# Patient Record
Sex: Male | Born: 1985 | Race: Black or African American | Hispanic: No | Marital: Single | Smoking: Current every day smoker
Health system: Southern US, Community
[De-identification: ages and names within clinical notes are randomized; demographics above are authoritative.]

## PROBLEM LIST (undated history)

## (undated) HISTORY — PX: ROTATOR CUFF REPAIR: SHX139

---

## 2008-09-12 ENCOUNTER — Emergency Department (HOSPITAL_COMMUNITY): Admission: EM | Admit: 2008-09-12 | Discharge: 2008-09-12 | Payer: Self-pay | Admitting: Emergency Medicine

## 2009-08-04 IMAGING — CR DG CHEST 2V
2 series · 2 of 2 positions shown · non-contrast
Comparison: None

CLINICAL DATA: Fever.  Congestion.

CHEST - 2 VIEW

[w chest pa]
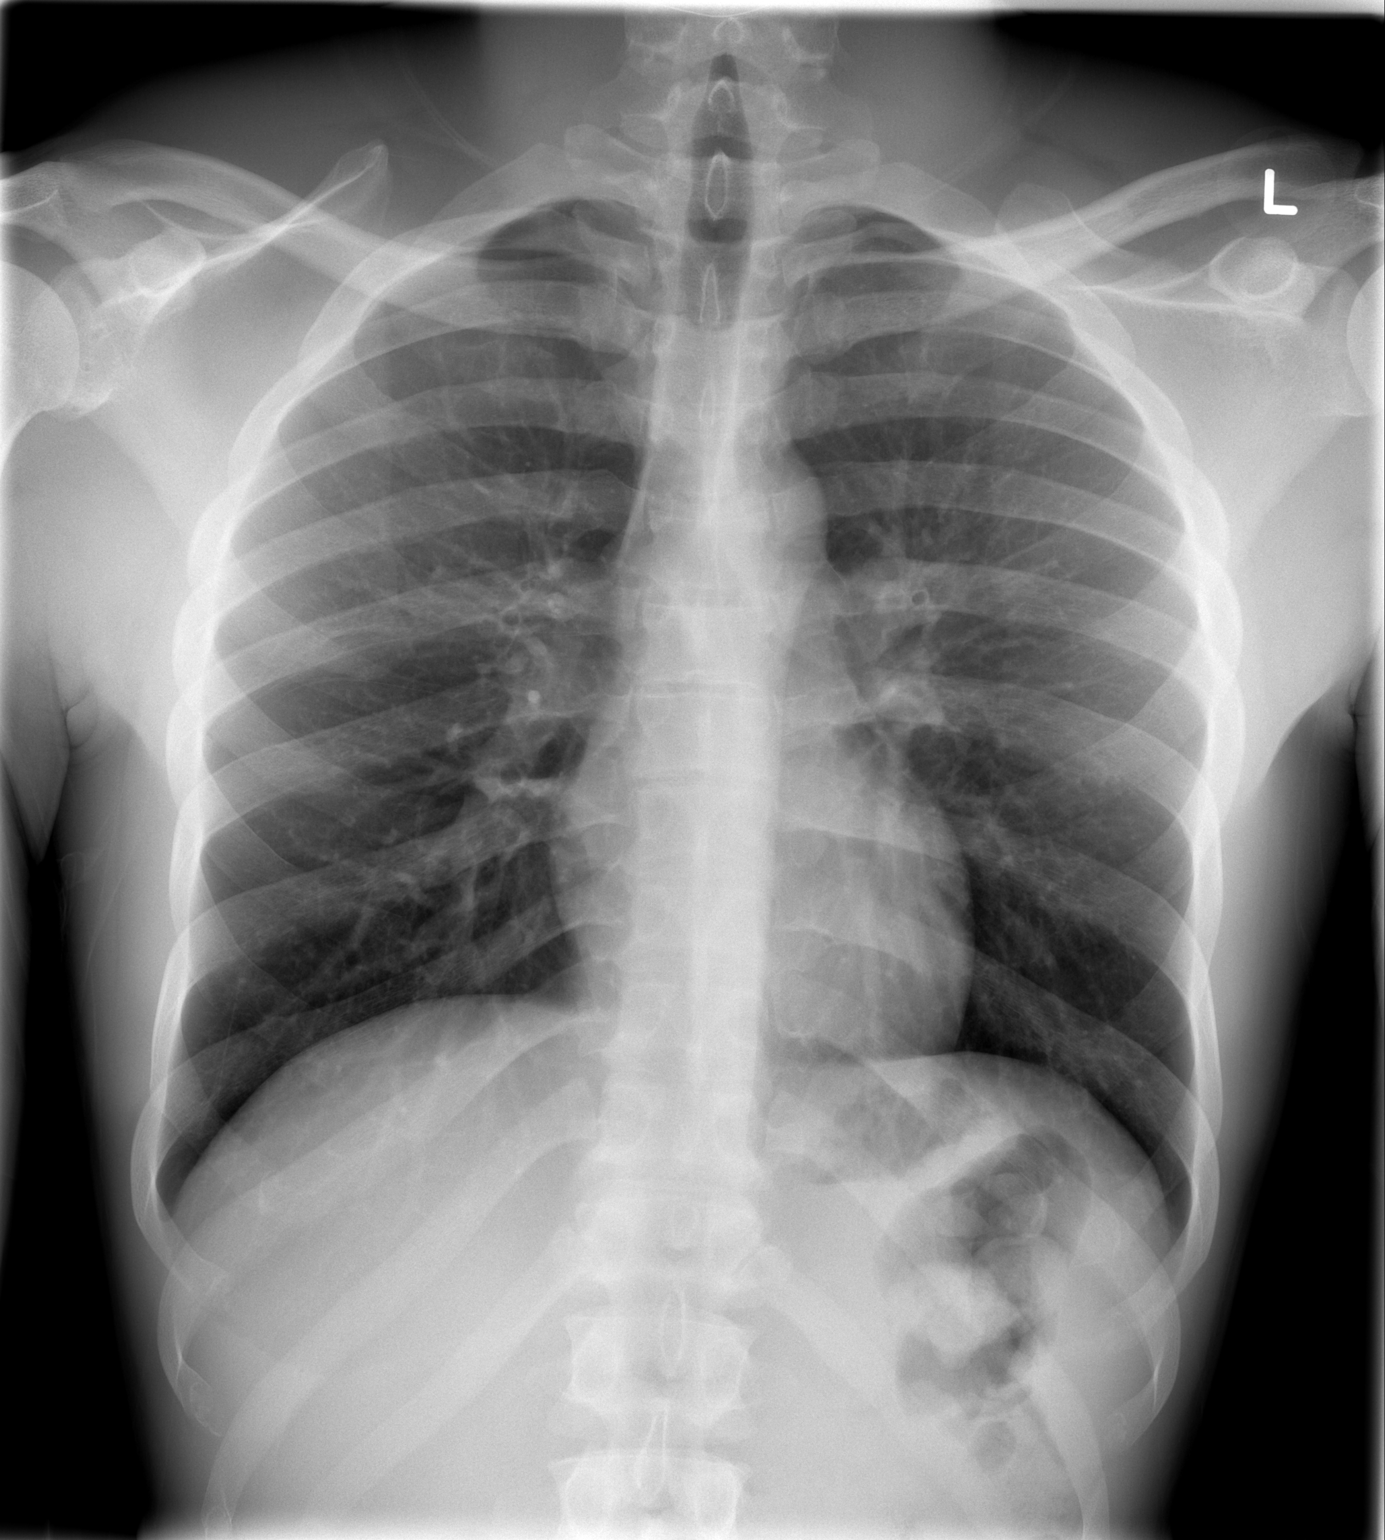

[w chest lat]
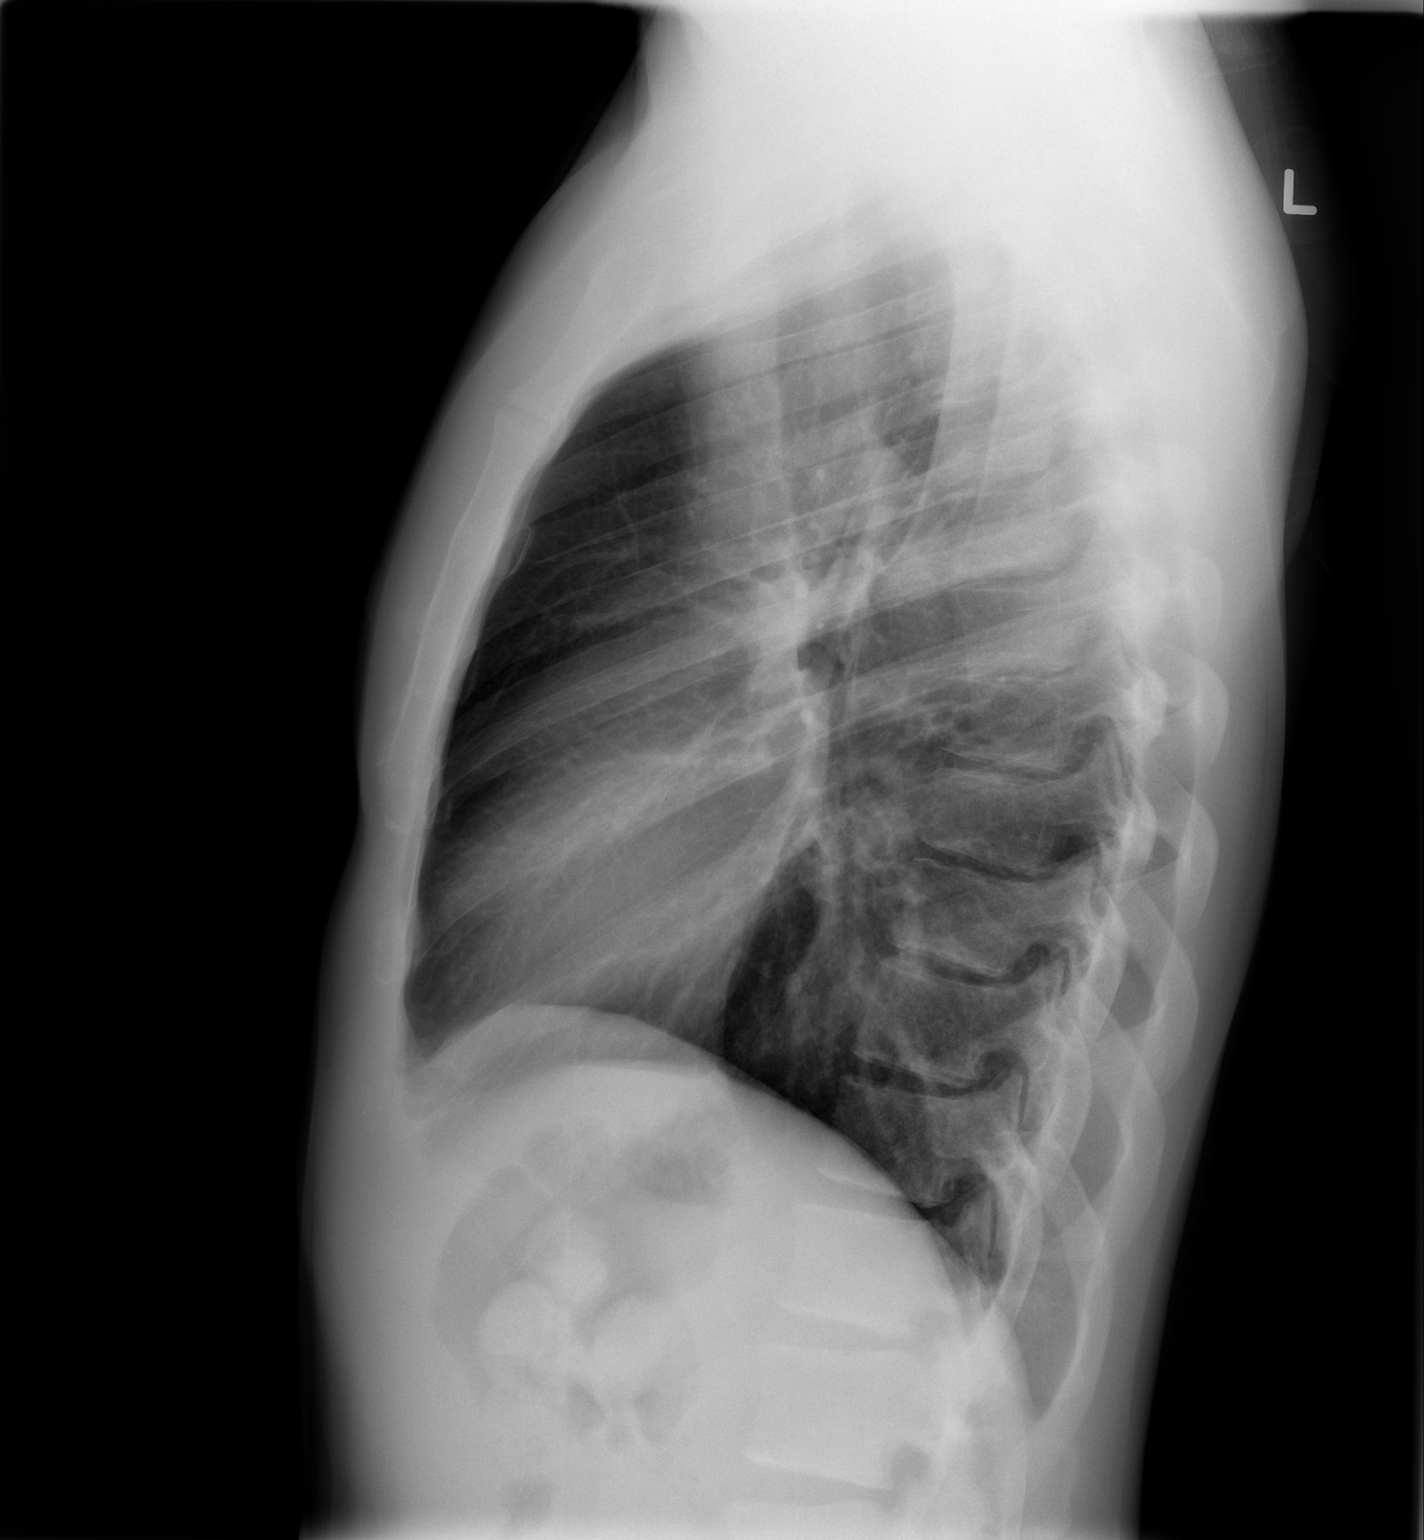

[2 of 2 positions shown; findings below may reference images not displayed]

FINDINGS: Midline trachea. Normal heart size and mediastinal
contours. No pleural effusion or pneumothorax. Clear lungs.
IMPRESSION: No acute cardiopulmonary disease.

## 2011-12-07 ENCOUNTER — Encounter (HOSPITAL_COMMUNITY): Payer: Self-pay | Admitting: *Deleted

## 2011-12-07 ENCOUNTER — Emergency Department (HOSPITAL_COMMUNITY)
Admission: EM | Admit: 2011-12-07 | Discharge: 2011-12-07 | Disposition: A | Discharge status: Home / Self Care | Payer: 59 | Source: Home / Self Care | Attending: Emergency Medicine | Admitting: Emergency Medicine

## 2011-12-07 DIAGNOSIS — N342 Other urethritis: Secondary | ICD-10-CM

## 2011-12-07 MED ORDER — LIDOCAINE HCL (PF) 1 % IJ SOLN
INTRAMUSCULAR | Status: AC
  Filled 2011-12-07: qty 5

## 2011-12-07 MED ORDER — CEFTRIAXONE SODIUM 250 MG IJ SOLR
250.0000 mg | Freq: Once | INTRAMUSCULAR | Status: AC
  Administered 2011-12-07: 250 mg via INTRAMUSCULAR

## 2011-12-07 MED ORDER — CEFTRIAXONE SODIUM 250 MG IJ SOLR
INTRAMUSCULAR | Status: AC
  Filled 2011-12-07: qty 250

## 2011-12-07 MED ORDER — AZITHROMYCIN 250 MG PO TABS
1000.0000 mg | ORAL_TABLET | Freq: Once | ORAL | Status: AC
  Administered 2011-12-07: 1000 mg via ORAL

## 2011-12-07 MED ORDER — AZITHROMYCIN 250 MG PO TABS
ORAL_TABLET | ORAL | Status: AC
  Filled 2011-12-07: qty 4

## 2011-12-07 NOTE — Discharge Instructions (Signed)
We will contact you if abnormal test results. Or further treatment needed   Urethritis, Adult Urethritis is an inflammation (soreness) of the urethra (the tube exiting from the bladder). It is often caused by germs that may be spread through sexual contact. TREATMENT  Urethritis will usually respond to antibiotics. These are medications that kill germs. Take all the medicine given to you. You may feel better in a couple days, but TAKE ALL MEDICINE or the infection may not be completely cured and may become more difficult to treat. Response can generally be expected in 7 to 10 days. You may require additional treatment after more testing. HOME CARE INSTRUCTIONS  Not have sex until the test results are known and treatment is completed.   Know that you may be asked to notify your sex partner when your final test results are back.   Finish all medications as prescribed.   Prevent sexually transmitted infections including AIDS. Practice safe sex. Use condoms.  SEEK MEDICAL CARE IF:   Your symptoms are not improved in 2 to 3 days.   Your symptoms are getting worse.   Your develop abdominal pain.   You develop joint pain.  SEEK IMMEDIATE MEDICAL CARE IF:   You have a fever.   You develop severe pain in the belly, back or side.   You develop repeated vomiting.  TEST RESULTS Not all test results are available during your visit. If your test results are not back during the visit, make an appointment with your caregiver to find out the results. Do not assume everything is normal if you have not heard from your caregiver or the medical facility. It is important for you to follow-up on all of your test results. Document Released: 12/18/2000 Document Revised: 06/13/2011 Document Reviewed: 07/10/2009 [facility] Patient Information 2012 [place], [place].Urethritis, Adult Urethritis is an inflammation (soreness) of the urethra (the tube exiting from the bladder). It is often caused by germs that may  be spread through sexual contact. TREATMENT  Urethritis will usually respond to antibiotics. These are medications that kill germs. Take all the medicine given to you. You may feel better in a couple days, but TAKE ALL MEDICINE or the infection may not be completely cured and may become more difficult to treat. Response can generally be expected in 7 to 10 days. You may require additional treatment after more testing. HOME CARE INSTRUCTIONS  Not have sex until the test results are known and treatment is completed.   Know that you may be asked to notify your sex partner when your final test results are back.   Finish all medications as prescribed.   Prevent sexually transmitted infections including AIDS. Practice safe sex. Use condoms.  SEEK MEDICAL CARE IF:   Your symptoms are not improved in 2 to 3 days.   Your symptoms are getting worse.   Your develop abdominal pain.   You develop joint pain.  SEEK IMMEDIATE MEDICAL CARE IF:   You have a fever.   You develop severe pain in the belly, back or side.   You develop repeated vomiting.  TEST RESULTS Not all test results are available during your visit. If your test results are not back during the visit, make an appointment with your caregiver to find out the results. Do not assume everything is normal if you have not heard from your caregiver or the medical facility. It is important for you to follow-up on all of your test results. Document Released: 12/18/2000 Document Revised: 06/13/2011 Document  Reviewed: 07/10/2009 [facility] Patient Information 2012 [place], [place].

## 2011-12-07 NOTE — ED Notes (Signed)
Pt with difficulty urinating and painful urination onset x 6 days - mild penile discharge - seen [facility] medication urine test only - girlfriend with bacterial vaginosis

## 2011-12-07 NOTE — ED Provider Notes (Signed)
History     CSN: [id number]  Arrival date & time 12/07/11  1123   First MD Initiated Contact with Patient 12/07/11 1151      Chief Complaint  Patient presents with  . Dysuria  . Penile Discharge    (Consider location/radiation/quality/duration/timing/severity/associated sxs/prior treatment) HPI Comments: Presents urgent care complaining of discomfort when he urinates and a clear penile discharge for about 5 days. Describes his girlfriend was attended at another clinic and was told that she had bacterial vaginosis. Denies any fevers, abdominal pain rectal pain. Denies any systemic rashes.  Patient is a 26 y.o. male presenting with dysuria and penile discharge. The history is provided by the patient.  Dysuria  This is a new problem. The current episode started more than 2 days ago. The problem occurs every urination. The problem has been gradually worsening. The pain is at a severity of 2/10. The pain is mild. There has been no fever. Associated symptoms include discharge. Pertinent negatives include no chills, no sweats, no nausea, no hesitancy, no urgency and no flank pain. He has tried nothing for the symptoms. His past medical history does not include kidney stones, recurrent UTIs or urinary stasis.  Penile Discharge    History reviewed. No pertinent past medical history.  Past Surgical History  Procedure Date  . Rotator cuff repair     History reviewed. No pertinent family history.  History  Substance Use Topics  . Smoking status: Current Everyday Smoker  . Smokeless tobacco: Not on file  . Alcohol Use: Yes      Review of Systems  Constitutional: Negative for fever, chills, activity change, appetite change and fatigue.  Gastrointestinal: Negative for nausea.  Genitourinary: Positive for dysuria and discharge. Negative for hesitancy, urgency, flank pain, difficulty urinating and penile pain.    Allergies  Review of patient's allergies indicates no known  allergies.  Home Medications   Current Outpatient Rx  Name Route Sig Dispense Refill  . PROMETHAZINE HCL 25 MG PO TABS Oral Take 25 mg by mouth every 6 (six) hours as needed.      BP 150/99  Pulse 70  Temp(Src) 97.8 F (36.6 C) (Oral)  Resp 17  SpO2 100%  Physical Exam  Nursing note and vitals reviewed. Constitutional: He appears well-developed and well-nourished.  HENT:  Head: Normocephalic.  Eyes: Conjunctivae are normal.  Neck: Neck supple.  Abdominal: There is no tenderness.  Genitourinary: Testes normal. Guaiac negative stool. No penile tenderness. Discharge found.  Neurological: He is alert.  Skin: No rash noted.    ED Course  Procedures (including critical care time)   Labs Reviewed  GC/CHLAMYDIA PROBE AMP, GENITAL   No results found.   1. Urethritis       MDM  Penile discharge. Samples obtained for STD screening discuss empirical treatment patient agreed to be treated today. Provided ceftriaxone and azithromycin.        Jimmie Molly, MD 12/07/11 [phone]

## 2011-12-07 NOTE — ED Notes (Signed)
No adverse reaction to medication

## 2011-12-08 MED ORDER — AZITHROMYCIN 500 MG PO TABS: 1000.0000 mg | ORAL_TABLET | Freq: Once | ORAL | Status: AC

## 2011-12-08 NOTE — ED Notes (Signed)
Pt called stated he vomited yesterday after leaving [facility] and receiving antibiotic pills -   Per order dr Ladon Applebaum azithromycin one gram po x one dose today with food called to [facility] on [address] pt notified

## 2011-12-09 LAB — GC/CHLAMYDIA PROBE AMP, GENITAL
Chlamydia, DNA Probe: NEGATIVE
GC Probe Amp, Genital: NEGATIVE

## 2011-12-10 ENCOUNTER — Telehealth (HOSPITAL_COMMUNITY): Payer: Self-pay | Admitting: *Deleted

## 2011-12-10 NOTE — ED Notes (Signed)
6/3 Dr. Tressia Danas found Rx. of Zithromax 1 gm po x1 on the printer and wanted me to call to make sure pt. got the medicine.  6/4 I called and left message for pt. to call. I read Colin Broach note that said she called in the Rx. and pt. was notified. I will verify this. Vassie Moselle 12/10/2011

## 2011-12-11 ENCOUNTER — Telehealth (HOSPITAL_COMMUNITY): Payer: Self-pay | Admitting: *Deleted

## 2011-12-13 ENCOUNTER — Telehealth (HOSPITAL_COMMUNITY): Payer: Self-pay | Admitting: *Deleted

## 2011-12-13 NOTE — ED Notes (Addendum)
Pt. called back and said he did get the Rx. of Zithromax ( 2 red pills.) He thought I was calling him about his lab results. I said no, I had called about his Rx. but I would check his results for him.  Pt. verified x 2 and given results ( GC/Chlamydia neg.). Kevin Gordon 12/13/2011

## 2013-07-31 ENCOUNTER — Emergency Department (INDEPENDENT_AMBULATORY_CARE_PROVIDER_SITE_OTHER): Payer: BC Managed Care – PPO

## 2013-07-31 ENCOUNTER — Encounter (HOSPITAL_COMMUNITY): Payer: Self-pay | Admitting: Emergency Medicine

## 2013-07-31 ENCOUNTER — Emergency Department (HOSPITAL_COMMUNITY)
Admission: EM | Admit: 2013-07-31 | Discharge: 2013-07-31 | Disposition: A | Discharge status: Home / Self Care | Payer: BC Managed Care – PPO | Source: Home / Self Care | Attending: Emergency Medicine | Admitting: Emergency Medicine

## 2013-07-31 DIAGNOSIS — J111 Influenza due to unidentified influenza virus with other respiratory manifestations: Secondary | ICD-10-CM

## 2013-07-31 DIAGNOSIS — R69 Illness, unspecified: Principal | ICD-10-CM

## 2013-07-31 LAB — POCT URINALYSIS DIP (DEVICE)
Bilirubin Urine: NEGATIVE
Glucose, UA: NEGATIVE mg/dL
HGB URINE DIPSTICK: NEGATIVE
Ketones, ur: 15 mg/dL — AB
Leukocytes, UA: NEGATIVE
NITRITE: NEGATIVE
PH: 7.5 (ref 5.0–8.0)
Protein, ur: NEGATIVE mg/dL
SPECIFIC GRAVITY, URINE: 1.02 (ref 1.005–1.030)
UROBILINOGEN UA: 2 mg/dL — AB (ref 0.0–1.0)

## 2013-07-31 MED ORDER — IBUPROFEN 800 MG PO TABS
ORAL_TABLET | ORAL | Status: AC
  Filled 2013-07-31: qty 1

## 2013-07-31 MED ORDER — HYDROCOD POLST-CHLORPHEN POLST 10-8 MG/5ML PO LQCR: 5.0000 mL | Freq: Two times a day (BID) | ORAL | Status: AC | PRN

## 2013-07-31 MED ORDER — IBUPROFEN 800 MG PO TABS
800.0000 mg | ORAL_TABLET | Freq: Once | ORAL | Status: AC
  Administered 2013-07-31: 800 mg via ORAL

## 2013-07-31 MED ORDER — OSELTAMIVIR PHOSPHATE 75 MG PO CAPS: 75.0000 mg | ORAL_CAPSULE | Freq: Two times a day (BID) | ORAL | Status: AC

## 2013-07-31 NOTE — ED Notes (Addendum)
C/O body aches, chills, dry cough, runny nose, back pain onset today.  Has not taken any meds.  Pt with chills, writhing, sitting in wheelchair.  After medicating pt and assisting onto exam table to lay down, pt unable to lay back against back of exam table due to "excruciating" low back/flank pain.  Denies vomiting.

## 2013-07-31 NOTE — ED Provider Notes (Signed)
CSN: [id number]     Arrival date & time 07/31/13  1825 History   First MD Initiated Contact with Patient 07/31/13 1923     Chief Complaint  Patient presents with  . Influenza  . Back Pain   (Consider location/radiation/quality/duration/timing/severity/associated sxs/prior Treatment) HPI Comments: 28 year old male presents complaining of fever, cough, sore throat, headache, and back pain. He reports having what he assumed to be the flu about 2 weeks ago. This got completely. Morning, he will go with a high fever of 102, and since then the back has started hurting very badly. he is still having the fever and cough but they are not near as bad as the back pain is at this time.   History reviewed. No pertinent past medical history. Past Surgical History  Procedure Laterality Date  . Rotator cuff repair     No family history on file. History  Substance Use Topics  . Smoking status: Current Every Day Smoker  . Smokeless tobacco: Not on file  . Alcohol Use: Yes     Comment: occasional    Review of Systems  Constitutional: Positive for chills. Negative for fever and fatigue.  HENT: Positive for congestion and rhinorrhea. Negative for sore throat.   Eyes: Negative for visual disturbance.  Respiratory: Positive for cough. Negative for shortness of breath.   Cardiovascular: Negative for chest pain, palpitations and leg swelling.  Gastrointestinal: Negative for nausea, vomiting, abdominal pain, diarrhea and constipation.  Genitourinary: Negative for dysuria, urgency, frequency and hematuria.  Musculoskeletal: Positive for back pain and myalgias. Negative for arthralgias, neck pain and neck stiffness.  Skin: Negative for rash.  Neurological: Negative for dizziness, weakness and light-headedness.    Allergies  Review of patient's allergies indicates no known allergies.  Home Medications   Current Outpatient Rx  Name  Route  Sig  Dispense  Refill  . chlorpheniramine-HYDROcodone  (TUSSIONEX PENNKINETIC ER) 10-8 MG/5ML LQCR   Oral   Take 5 mLs by mouth every 12 (twelve) hours as needed for cough (or for back pain).   115 mL   0   . oseltamivir (TAMIFLU) 75 MG capsule   Oral   Take 1 capsule (75 mg total) by mouth every 12 (twelve) hours.   10 capsule   0   . promethazine (PHENERGAN) 25 MG tablet   Oral   Take 25 mg by mouth every 6 (six) hours as needed.          BP 126/71  Pulse 109  Temp(Src) 100.4 F (38 C) (Oral)  Resp 26  SpO2 100% Physical Exam  Nursing note and vitals reviewed. Constitutional: He is oriented to person, place, and time. He appears well-developed and well-nourished. He appears ill. He appears distressed.  HENT:  Head: Normocephalic and atraumatic.  Right Ear: Tympanic membrane and ear canal normal.  Left Ear: Tympanic membrane and ear canal normal.  Nose: Right sinus exhibits no maxillary sinus tenderness and no frontal sinus tenderness. Left sinus exhibits no maxillary sinus tenderness and no frontal sinus tenderness.  Mouth/Throat: Uvula is midline. No oropharyngeal exudate, posterior oropharyngeal edema or posterior oropharyngeal erythema.  Neck: Normal range of motion. Neck supple.  Cardiovascular: Regular rhythm and normal heart sounds.  Tachycardia present.   Pulmonary/Chest: Effort normal and breath sounds normal. No respiratory distress.  Musculoskeletal:       Lumbar back: Normal.  Lymphadenopathy:    He has no cervical adenopathy.  Neurological: He is alert and oriented to person, place, and time. Coordination normal.  Skin: Skin is warm and dry. No rash noted. He is not diaphoretic.  Psychiatric: He has a normal mood and affect. Judgment normal.    ED Course  Procedures (including critical care time) Labs Review Labs Reviewed  POCT URINALYSIS DIP (DEVICE) - Abnormal; Notable for the following:    Ketones, ur 15 (*)    Urobilinogen, UA 2.0 (*)    All other components within normal limits   Imaging  Review Dg Chest 2 View  07/31/2013   CLINICAL DATA:  Cough and fever.  Influenza.  EXAM: CHEST  2 VIEW  COMPARISON:  09/12/2008  FINDINGS: The heart size and mediastinal contours are within normal limits. Both lungs are clear. The visualized skeletal structures are unremarkable.  IMPRESSION: No active cardiopulmonary disease.   Electronically Signed   By: Myles Rosenthal M.D.   On: 07/31/2013 20:00    EKG Interpretation    Date/Time:    Ventricular Rate:    PR Interval:    QRS Duration:   QT Interval:    QTC Calculation:   R Axis:     Text Interpretation:              MDM   1. Influenza-like illness    Physical exam is normal, neck is supple  Chest x-ray is normal  Treating for flu. Recheck in 2 days  Meds ordered this encounter  Medications  . ibuprofen (ADVIL,MOTRIN) tablet 800 mg    Sig:   . oseltamivir (TAMIFLU) 75 MG capsule    Sig: Take 1 capsule (75 mg total) by mouth every 12 (twelve) hours.    Dispense:  10 capsule    Refill:  0    Order Specific Question:  Supervising Provider    Answer:  Linna Hoff [phone]  . chlorpheniramine-HYDROcodone (TUSSIONEX PENNKINETIC ER) 10-8 MG/5ML LQCR    Sig: Take 5 mLs by mouth every 12 (twelve) hours as needed for cough (or for back pain).    Dispense:  115 mL    Refill:  0    Order Specific Question:  Supervising Provider    Answer:  Bradd Canary D [[id number]]     Graylon Good, PA-C 08/02/13 [phone]

## 2013-07-31 NOTE — ED Notes (Signed)
Pt resting.  States feeling better some.  Chills have improved.  Pt much more calm.

## 2013-07-31 NOTE — Discharge Instructions (Signed)

## 2013-08-02 NOTE — ED Provider Notes (Signed)
Medical screening examination/treatment/procedure(s) were performed by resident physician or non-physician practitioner and as supervising physician I was immediately available for consultation/collaboration.   Halil Rentz DOUGLAS MD.   Hiral Lukasiewicz D Estephan Gallardo, MD 08/02/13 1441 

## 2014-01-11 DIAGNOSIS — Z79899 Other long term (current) drug therapy: Secondary | ICD-10-CM | POA: Insufficient documentation

## 2014-01-11 DIAGNOSIS — F17200 Nicotine dependence, unspecified, uncomplicated: Secondary | ICD-10-CM | POA: Insufficient documentation

## 2014-01-11 DIAGNOSIS — IMO0002 Reserved for concepts with insufficient information to code with codable children: Secondary | ICD-10-CM | POA: Insufficient documentation

## 2014-01-12 ENCOUNTER — Encounter (HOSPITAL_COMMUNITY): Payer: Self-pay | Admitting: Emergency Medicine

## 2014-01-12 ENCOUNTER — Emergency Department (HOSPITAL_COMMUNITY)
Admission: EM | Admit: 2014-01-12 | Discharge: 2014-01-12 | Disposition: A | Discharge status: Home / Self Care | Payer: BC Managed Care – PPO | Attending: Emergency Medicine | Admitting: Emergency Medicine

## 2014-01-12 DIAGNOSIS — L0390 Cellulitis, unspecified: Secondary | ICD-10-CM

## 2014-01-12 DIAGNOSIS — L0291 Cutaneous abscess, unspecified: Secondary | ICD-10-CM

## 2014-01-12 MED ORDER — SULFAMETHOXAZOLE-TMP DS 800-160 MG PO TABS: 1.0000 | ORAL_TABLET | Freq: Once | ORAL | Status: DC

## 2014-01-12 MED ORDER — SULFAMETHOXAZOLE-TRIMETHOPRIM 800-160 MG PO TABS: 1.0000 | ORAL_TABLET | Freq: Two times a day (BID) | ORAL | Status: AC

## 2014-01-12 NOTE — ED Notes (Signed)
Present with indurated area to left lateral forearm began on July 4th. Site is draining white discharge. Pt states, "I think I was bit by something"

## 2014-01-12 NOTE — ED Provider Notes (Signed)
Medical screening examination/treatment/procedure(s) were performed by non-physician practitioner and as supervising physician I was immediately available for consultation/collaboration.   EKG Interpretation None       Karesa Maultsby K Tywone Bembenek-Rasch, MD 01/12/14 0212 

## 2014-01-12 NOTE — Discharge Instructions (Signed)
Take your antibiotics as directed and to completion. You should never have any leftover antibiotics! Push fluids and stay well hydrated.   If you see signs of infection (warmth, redness, tenderness, pus, sharp increase in pain, fever, red streaking) immediately return to the emergency department.  Do not hesitate to return to the Emergency Department for any new, worsening or concerning symptoms.   If you do not have a primary care doctor you can establish one at the   [facility]: [address] [place] [address] [phone]  After you establish care. Let them know you were seen in the emergency room. They must obtain records for further management.    Abscess An abscess is an infected area that contains a collection of pus and debris.It can occur in almost any part of the body. An abscess is also known as a furuncle or boil. CAUSES  An abscess occurs when tissue gets infected. This can occur from blockage of oil or sweat glands, infection of hair follicles, or a minor injury to the skin. As the body tries to fight the infection, pus collects in the area and creates pressure under the skin. This pressure causes pain. People with weakened immune systems have difficulty fighting infections and get certain abscesses more often.  SYMPTOMS Usually an abscess develops on the skin and becomes a painful mass that is red, warm, and tender. If the abscess forms under the skin, you may feel a moveable soft area under the skin. Some abscesses break open (rupture) on their own, but most will continue to get worse without care. The infection can spread deeper into the body and eventually into the bloodstream, causing you to feel ill.  DIAGNOSIS  Your caregiver will take your medical history and perform a physical exam. A sample of fluid may also be taken from the abscess to determine what is causing your infection. TREATMENT  Your caregiver may prescribe antibiotic medicines to  fight the infection. However, taking antibiotics alone usually does not cure an abscess. Your caregiver may need to make a small cut (incision) in the abscess to drain the pus. In some cases, gauze is packed into the abscess to reduce pain and to continue draining the area. HOME CARE INSTRUCTIONS   Only take over-the-counter or prescription medicines for pain, discomfort, or fever as directed by your caregiver.  If you were prescribed antibiotics, take them as directed. Finish them even if you start to feel better.  If gauze is used, follow your caregiver's directions for changing the gauze.  To avoid spreading the infection:  Keep your draining abscess covered with a bandage.  Wash your hands well.  Do not share personal care items, towels, or whirlpools with others.  Avoid skin contact with others.  Keep your skin and clothes clean around the abscess.  Keep all follow-up appointments as directed by your caregiver. SEEK MEDICAL CARE IF:   You have increased pain, swelling, redness, fluid drainage, or bleeding.  You have muscle aches, chills, or a general ill feeling.  You have a fever. MAKE SURE YOU:   Understand these instructions.  Will watch your condition.  Will get help right away if you are not doing well or get worse. Document Released: 04/03/2005 Document Revised: 12/24/2011 Document Reviewed: 09/06/2011 [facility] Patient Information 2015 [place], [place]. This information is not intended to replace advice given to you by your health care provider. Make sure you discuss any questions you have with your health care provider.

## 2014-01-12 NOTE — ED Provider Notes (Signed)
CSN: [id number]     Arrival date & time 01/11/14  2348 History   First MD Initiated Contact with Patient 01/12/14 0010     Chief Complaint  Patient presents with  . Abscess     (Consider location/radiation/quality/duration/timing/severity/associated sxs/prior Treatment) HPI  Kevin Gordon is a 28 y.o. male complaining of abscess to left lateral arm noticed 4 days ago. Patient states that he has a known injury to the bed bug bite. Patient never had prior abscess, denies history of diabetes. Denies fever, chills, nausea vomiting   History reviewed. No pertinent past medical history. Past Surgical History  Procedure Laterality Date  . Rotator cuff repair     History reviewed. No pertinent family history. History  Substance Use Topics  . Smoking status: Current Every Day Smoker  . Smokeless tobacco: Not on file  . Alcohol Use: Yes     Comment: occasional    Review of Systems  10 systems reviewed and found to be negative, except as noted in the HPI.   Allergies  Review of patient's allergies indicates no known allergies.  Home Medications   Prior to Admission medications   Medication Sig Start Date End Date Taking? Authorizing Provider  chlorpheniramine-HYDROcodone (TUSSIONEX PENNKINETIC ER) 10-8 MG/5ML LQCR Take 5 mLs by mouth every 12 (twelve) hours as needed for cough (or for back pain). 07/31/13   Graylon GoodZachary H Baker, PA-C  oseltamivir (TAMIFLU) 75 MG capsule Take 1 capsule (75 mg total) by mouth every 12 (twelve) hours. 07/31/13   Graylon GoodZachary H Baker, PA-C  promethazine (PHENERGAN) 25 MG tablet Take 25 mg by mouth every 6 (six) hours as needed.    Historical Provider, MD  sulfamethoxazole-trimethoprim (SEPTRA DS) 800-160 MG per tablet Take 1 tablet by mouth every 12 (twelve) hours. 01/12/14   Maezie Justin, PA-C   BP 124/75  Pulse 80  Temp(Src) 98.4 F (36.9 C) (Oral)  Resp 18  SpO2 99% Physical Exam  Nursing note and vitals reviewed. Constitutional: He is  oriented to person, place, and time. He appears well-developed and well-nourished. No distress.  HENT:  Head: Normocephalic and atraumatic.  Mouth/Throat: Oropharynx is clear and moist.  Eyes: Conjunctivae and EOM are normal.  Cardiovascular: Normal rate, regular rhythm and intact distal pulses.   Pulmonary/Chest: Effort normal and breath sounds normal. No stridor. No respiratory distress. He has no wheezes. He has no rales. He exhibits no tenderness.  Abdominal: Soft.  Musculoskeletal: Normal range of motion.  Neurological: He is alert and oriented to person, place, and time.  Skin:  Fluctuant abscess to left elbow, 1 cm. Mild surrounding cellulitis  Psychiatric: He has a normal mood and affect.    ED Course  Procedures (including critical care time)  INCISION AND DRAINAGE Performed by: Wynetta EmeryPISCIOTTA, Silena Wyss Consent: Verbal consent obtained. Risks and benefits: risks, benefits and alternatives were discussed Type: abscess  Body area: Left forearm  Anesthesia: local infiltration  Incision was made with a scalpel.  Local anesthetic: lidocaine 2% with epinephrine  Anesthetic total: 3 ml  Complexity: complex Blunt dissection to break up loculations  Drainage: purulent  Drainage amount: Scant   Packing material: None   Patient tolerance: Patient tolerated the procedure well with no immediate complications.    Labs Review Labs Reviewed - No data to display  Imaging Review No results found.   EKG Interpretation None      MDM   Final diagnoses:  Abscess or cellulitis    Filed Vitals:   01/12/14 0006  BP:  124/75  Pulse: 80  Temp: 98.4 F (36.9 C)  TempSrc: Oral  Resp: 18  SpO2: 99%    Medications  sulfamethoxazole-trimethoprim (BACTRIM DS) 800-160 MG per tablet 1 tablet (not administered)    Kevin Gordon is a 28 y.o. male presenting with abscess and mild cellulitis to left forearm. I and D. performed. Because of cellulitis patient will be  started on antibiotics.  Evaluation does not show pathology that would require ongoing emergent intervention or inpatient treatment. Pt is hemodynamically stable and mentating appropriately. Discussed findings and plan with patient/guardian, who agrees with care plan. All questions answered. Return precautions discussed and outpatient follow up given.   New Prescriptions   SULFAMETHOXAZOLE-TRIMETHOPRIM (SEPTRA DS) 800-160 MG PER TABLET    Take 1 tablet by mouth every 12 (twelve) hours.         Joni Reiningicole Patric Vanpelt, PA-C 01/12/14 0150

## 2014-01-12 NOTE — ED Notes (Signed)
PA at bedside.
